# Patient Record
Sex: Male | Born: 1960 | Race: White | Hispanic: No | Marital: Married | Smoking: Former smoker
Health system: Southern US, Community
[De-identification: ages and names within clinical notes are randomized; demographics above are authoritative.]

## PROBLEM LIST (undated history)

## (undated) DIAGNOSIS — M549 Dorsalgia, unspecified: Secondary | ICD-10-CM

## (undated) DIAGNOSIS — I10 Essential (primary) hypertension: Secondary | ICD-10-CM

---

## 2011-07-06 ENCOUNTER — Emergency Department (HOSPITAL_BASED_OUTPATIENT_CLINIC_OR_DEPARTMENT_OTHER)
Admission: EM | Admit: 2011-07-06 | Discharge: 2011-07-06 | Disposition: A | Discharge status: Home / Self Care | Payer: 59 | Attending: Emergency Medicine | Admitting: Emergency Medicine

## 2011-07-06 ENCOUNTER — Encounter (HOSPITAL_BASED_OUTPATIENT_CLINIC_OR_DEPARTMENT_OTHER): Payer: Self-pay | Admitting: Emergency Medicine

## 2011-07-06 DIAGNOSIS — M5431 Sciatica, right side: Secondary | ICD-10-CM

## 2011-07-06 DIAGNOSIS — M545 Low back pain, unspecified: Secondary | ICD-10-CM | POA: Insufficient documentation

## 2011-07-06 DIAGNOSIS — M79609 Pain in unspecified limb: Secondary | ICD-10-CM | POA: Insufficient documentation

## 2011-07-06 DIAGNOSIS — M533 Sacrococcygeal disorders, not elsewhere classified: Secondary | ICD-10-CM | POA: Insufficient documentation

## 2011-07-06 DIAGNOSIS — M5430 Sciatica, unspecified side: Secondary | ICD-10-CM | POA: Insufficient documentation

## 2011-07-06 MED ORDER — OXYCODONE-ACETAMINOPHEN 5-325 MG PO TABS: 1.0000 | ORAL_TABLET | Freq: Four times a day (QID) | ORAL | Status: AC | PRN

## 2011-07-06 MED ORDER — IBUPROFEN 600 MG PO TABS: 600.0000 mg | ORAL_TABLET | Freq: Four times a day (QID) | ORAL | Status: AC | PRN

## 2011-07-06 NOTE — ED Notes (Signed)
Pt c/o RT lower back pain; no known injury

## 2011-07-06 NOTE — ED Provider Notes (Signed)
History     CSN: [id number]  Arrival date & time 07/06/11  1144   First MD Initiated Contact with Patient 07/06/11 1220      Chief Complaint  Patient presents with  . Back Pain    (Consider location/radiation/quality/duration/timing/severity/associated sxs/prior treatment) Patient is a 51 y.o. male presenting with back pain. The history is provided by the patient.  Back Pain  This is a new problem. Pertinent negatives include no numbness, no abdominal pain and no weakness.   patient's had some pain in his right lower back for the last few days. No injury. It radiates down his right leg. No weakness. Some mild relief with aspirin. No dysuria. No abdominal pain. Is worse with movement. Is worse with certain positions. It is worse lying down at night.  History reviewed. No pertinent past medical history.  History reviewed. No pertinent past surgical history.  No family history on file.  History  Substance Use Topics  . Smoking status: Current Everyday Smoker  . Smokeless tobacco: Not on file  . Alcohol Use: Yes     social      Review of Systems  Constitutional: Negative for fatigue.  Gastrointestinal: Negative for abdominal pain.  Genitourinary: Negative for frequency, flank pain and genital sores.  Musculoskeletal: Positive for back pain. Negative for joint swelling and gait problem.  Neurological: Negative for weakness and numbness.  Psychiatric/Behavioral: Negative for confusion.    Allergies  Review of patient's allergies indicates no known allergies.  Home Medications   Current Outpatient Rx  Name Route Sig Dispense Refill  . IBUPROFEN 600 MG PO TABS Oral Take 1 tablet (600 mg total) by mouth every 6 (six) hours as needed for pain. 30 tablet 0  . OXYCODONE-ACETAMINOPHEN 5-325 MG PO TABS Oral Take 1-2 tablets by mouth every 6 (six) hours as needed for pain. 20 tablet 0    BP 123/83  Pulse 65  Temp(Src) 98.1 F (36.7 C) (Oral)  Resp 16  SpO2 99%  Physical  Exam  Constitutional: He appears well-developed and well-nourished.  HENT:  Head: Normocephalic.  Abdominal: Soft. He exhibits no distension. There is no tenderness. There is no rebound.  Musculoskeletal: Normal range of motion.       Mild tenderness over right SI area. No rash. No numbness or weakness to light. Some pain with right straight leg raise  Neurological: He is alert.  Skin: Skin is warm.    ED Course  Procedures (including critical care time)  Labs Reviewed - No data to display No results found.   1. Sciatica of right side       MDM  Right lower back pain radiates down the extremity. Tenderness in the area. Likely sciatica. I doubt kidney stone or AAA as a cause. He'll followup as needed.        Juliet Rude. Rubin Payor, MD 07/06/11 1236

## 2011-07-21 ENCOUNTER — Emergency Department (INDEPENDENT_AMBULATORY_CARE_PROVIDER_SITE_OTHER): Payer: 59

## 2011-07-21 ENCOUNTER — Emergency Department (HOSPITAL_BASED_OUTPATIENT_CLINIC_OR_DEPARTMENT_OTHER)
Admission: EM | Admit: 2011-07-21 | Discharge: 2011-07-21 | Disposition: A | Discharge status: Home / Self Care | Payer: 59 | Attending: Emergency Medicine | Admitting: Emergency Medicine

## 2011-07-21 ENCOUNTER — Encounter (HOSPITAL_BASED_OUTPATIENT_CLINIC_OR_DEPARTMENT_OTHER): Payer: Self-pay | Admitting: *Deleted

## 2011-07-21 DIAGNOSIS — R51 Headache: Secondary | ICD-10-CM | POA: Insufficient documentation

## 2011-07-21 DIAGNOSIS — F17200 Nicotine dependence, unspecified, uncomplicated: Secondary | ICD-10-CM | POA: Insufficient documentation

## 2011-07-21 DIAGNOSIS — I10 Essential (primary) hypertension: Secondary | ICD-10-CM | POA: Insufficient documentation

## 2011-07-21 LAB — CBC
MCHC: 35.2 g/dL (ref 30.0–36.0)
Platelets: 164 10*3/uL (ref 150–400)
RDW: 13.1 % (ref 11.5–15.5)
WBC: 9 10*3/uL (ref 4.0–10.5)

## 2011-07-21 LAB — DIFFERENTIAL
Basophils Absolute: 0.1 10*3/uL (ref 0.0–0.1)
Basophils Relative: 1 % (ref 0–1)
Eosinophils Relative: 3 % (ref 0–5)
Lymphocytes Relative: 42 % (ref 12–46)
Neutro Abs: 4.3 10*3/uL (ref 1.7–7.7)

## 2011-07-21 LAB — BASIC METABOLIC PANEL
CO2: 25 mEq/L (ref 19–32)
Calcium: 9.4 mg/dL (ref 8.4–10.5)
Chloride: 104 mEq/L (ref 96–112)
Creatinine, Ser: 1.1 mg/dL (ref 0.50–1.35)
GFR calc Af Amer: 89 mL/min — ABNORMAL LOW (ref >90)
Sodium: 139 mEq/L (ref 135–145)

## 2011-07-21 MED ORDER — DIPHENHYDRAMINE HCL 50 MG/ML IJ SOLN
25.0000 mg | Freq: Once | INTRAMUSCULAR | Status: AC
  Administered 2011-07-21: 25 mg via INTRAVENOUS
  Filled 2011-07-21: qty 1

## 2011-07-21 MED ORDER — DEXAMETHASONE SODIUM PHOSPHATE 4 MG/ML IJ SOLN
4.0000 mg | Freq: Once | INTRAMUSCULAR | Status: AC
  Administered 2011-07-21: 4 mg via INTRAVENOUS
  Filled 2011-07-21: qty 1

## 2011-07-21 MED ORDER — METOCLOPRAMIDE HCL 5 MG/ML IJ SOLN
10.0000 mg | Freq: Once | INTRAMUSCULAR | Status: AC
  Administered 2011-07-21: 10 mg via INTRAVENOUS
  Filled 2011-07-21: qty 2

## 2011-07-21 NOTE — ED Notes (Signed)
Pt presents to ED today with "intense, worst ever" HA.  Pt reports no changes in vision at present.  Pt rates 8/10 HA.  Pt had similar episode 2 days ago.

## 2011-07-21 NOTE — ED Provider Notes (Signed)
History     CSN: [id number]  Arrival date & time 07/21/11  0101   First MD Initiated Contact with Patient 07/21/11 0114      Chief Complaint  Patient presents with  . Headache  . Hypertension    (Consider location/radiation/quality/duration/timing/severity/associated sxs/prior treatment) Patient is a 51 y.o. male presenting with headaches and hypertension. The history is provided by the patient and the spouse. No language interpreter was used.  Headache  This is a recurrent (one similar under similar circumstances 3 days ago) problem. The current episode started less than 1 hour ago. The problem occurs constantly. The problem has been gradually improving. Associated with: sexual intercourse. The pain is located in the occipital region. The quality of the pain is described as throbbing. The pain is at a severity of 8/10. The pain is severe. The pain does not radiate. Pertinent negatives include no anorexia, no fever, no malaise/fatigue, no chest pressure, no near-syncope, no orthopnea, no palpitations, no syncope, no shortness of breath, no nausea and no vomiting. He has tried NSAIDs for the symptoms. The treatment provided mild relief.  Hypertension Associated symptoms include headaches. Pertinent negatives include no shortness of breath.  Same headache which started during sexual activity 3 days ago but that headache went away with a warm shower.  Today no relief with shower or massage.  Pain went from 8 to 6 within 30 minutes of ibuprofen ingestion today and is currently a 6/10 without radiation.  Today's headache also started during sexual activity and was sudden onset. This headache and the one he had 3 days ago were both characterized as the most intense headaches the patient has had.  Wife checked his BP and it was in the 190s systolic on an electronic cuff at home.    History reviewed. No pertinent past medical history.  History reviewed. No pertinent past surgical history.  History  reviewed. No pertinent family history.  History  Substance Use Topics  . Smoking status: Current Everyday Smoker  . Smokeless tobacco: Not on file  . Alcohol Use: Yes     social      Review of Systems  Constitutional: Negative for fever and malaise/fatigue.  HENT: Negative for neck pain and neck stiffness.   Eyes: Negative.  Negative for photophobia and visual disturbance.  Respiratory: Negative for shortness of breath.   Cardiovascular: Negative for palpitations, orthopnea, syncope and near-syncope.  Gastrointestinal: Negative for nausea, vomiting and anorexia.  Genitourinary: Negative.   Musculoskeletal: Negative.   Skin: Negative.   Neurological: Positive for headaches. Negative for seizures, syncope, speech difficulty and numbness.  Hematological: Negative.   Psychiatric/Behavioral: Negative.     Allergies  Review of patient's allergies indicates no known allergies.  Home Medications  No current outpatient prescriptions on file.  BP 132/90  Pulse 64  Temp(Src) 97.7 F (36.5 C) (Oral)  Resp 16  Ht 6' (1.829 m)  Wt 245 lb (111.131 kg)  BMI 33.23 kg/m2  SpO2 100%  Physical Exam  Constitutional: He is oriented to person, place, and time. He appears well-developed and well-nourished.  HENT:  Head: Normocephalic and atraumatic.  Right Ear: External ear normal.  Left Ear: External ear normal.  Mouth/Throat: Oropharynx is clear and moist.  Eyes: Conjunctivae and EOM are normal. Pupils are equal, round, and reactive to light.  Neck: Normal range of motion. Neck supple.       No meningeal signs  Cardiovascular: Normal rate and regular rhythm.   Pulmonary/Chest: Effort normal and breath sounds  normal. He has no wheezes. He has no rales.  Abdominal: Soft. Bowel sounds are normal. There is no tenderness. There is no rebound and no guarding.  Musculoskeletal: Normal range of motion. He exhibits edema.  Lymphadenopathy:    He has no cervical adenopathy.  Neurological:  He is alert and oriented to person, place, and time. He has normal reflexes. He displays normal reflexes. No cranial nerve deficit.       Gait normal  Skin: Skin is warm and dry.  Psychiatric: He has a normal mood and affect. Judgment and thought content normal.    ED Course  Procedures (including critical care time)  Labs Reviewed  BASIC METABOLIC PANEL - Abnormal; Notable for the following:    Glucose, Bld 104 (*)    GFR calc non Af Amer 77 (*)    GFR calc Af Amer 89 (*)    All other components within normal limits  CBC  DIFFERENTIAL   Ct Head Wo Contrast  07/21/2011  *RADIOLOGY REPORT*  Clinical Data: Severe headache; hypertension.  CT HEAD WITHOUT CONTRAST  Technique:  Contiguous axial images were obtained from the base of the skull through the vertex without contrast.  Comparison: None.  Findings: There is no evidence of acute infarction, mass lesion, or intra- or extra-axial hemorrhage on CT.  The posterior fossa, including the cerebellum, brainstem and fourth ventricle, is within normal limits.  The third and lateral ventricles, and basal ganglia are unremarkable in appearance.  The cerebral hemispheres are symmetric in appearance, with normal gray- white differentiation.  No mass effect or midline shift is seen.  There is no evidence of fracture; visualized osseous structures are unremarkable in appearance.  The orbits are within normal limits. The paranasal sinuses and mastoid air cells are well-aerated.  No significant soft tissue abnormalities are seen.  IMPRESSION: Unremarkable noncontrast CT of the head.  Original Report Authenticated By: Tonia Ghent, M.D.     1. Headache       MDM  EDP immediately recommended lumbar puncture based on patient's symptoms.  Strongly encouraged LP even prior to Ct scan of the head as story is concerning for sentinel leak of aneurysm.    EDP returned following CT review and radiology read and stated that lumbar puncture was necessary and  imperative to diagnose subarachnoid hemorrhage and it was necessary to do this procedure immediately in order to make the diagnosis and arrange for further care..  EDP explained that the benefits of lumbar puncture were diagnosis of earlier aneurysmal bleed and or subarachnoid hemorrhage and thus transfer to a facility to further intervention and treatment of same.  EDP also explained that the risks of lumbar puncture were infection, minimized by surgical cleansing of the skin, bleeding at the site, worsening headache, persistent CSF leak occasionally requiring blood patch.    Patient and wife given 10 minutes in private to discuss the risks and benefits of the procedure.  Patient and wife discussed the procedure and the risks and benefits.  EDP again strongly advised that the patient undergo lumbar puncture because his symptoms were extremely concerning for subarachnoid bleed.  Patient is of sound mind and has decision making capacity to refuse further care and refuses lumbar puncture. EDP explained that this decision was against the best medical advice.  Patient verbalizes understanding that his decision is against sound medical advice.  Patient and wife advised that the risks of refusing procedure and further care and of signing out against medical advice are but are  not limited to:  Death, stroke, aneurysm, intracranial hemorrhage, coma, paralysis, hemiplegia, visual deficits, aphasia, speech impairment, and prolonged morbidity.  Patient verbalizes understanding of these risks and verbalizes willingness to accept these risks.  He is welcomed to return at any time.      Jasmine Awe, MD 07/21/11 [phone]

## 2011-07-21 NOTE — Discharge Instructions (Signed)

## 2013-04-18 IMAGING — CT CT HEAD W/O CM
1 series · 16 of 30 positions shown, 20 images · non-contrast
Comparison: None.

CLINICAL DATA: Severe headache; hypertension.

CT HEAD WITHOUT CONTRAST
TECHNIQUE: Contiguous axial images were obtained from the base of
the skull through the vertex without contrast.

[Series 2: head 4.8 h37s · axial · 0.45mm/px · z∈[-87,+73]mm · 16 of 36 slices shown, 20 images]
[im 2/36  brain]
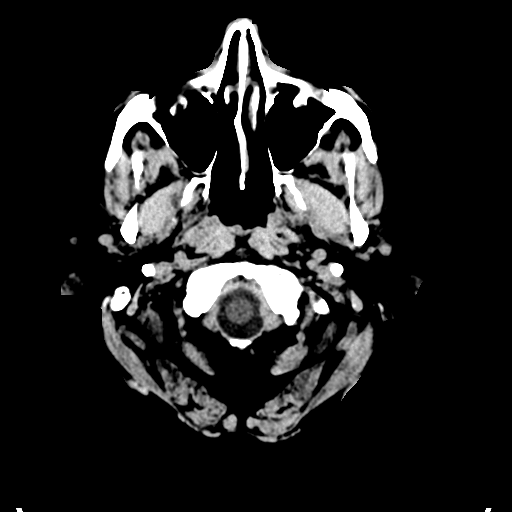
[im 2/36  bone]
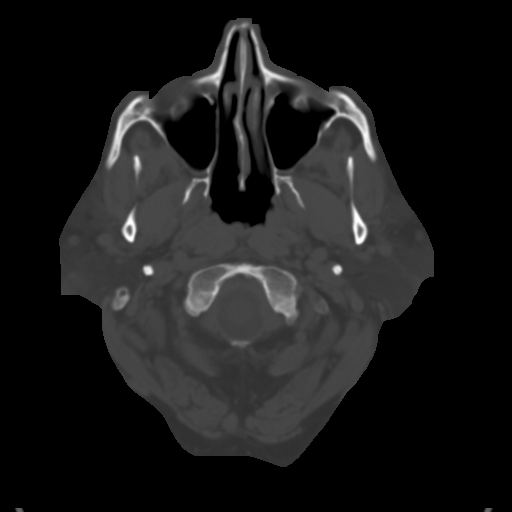
[im 4/36  brain]
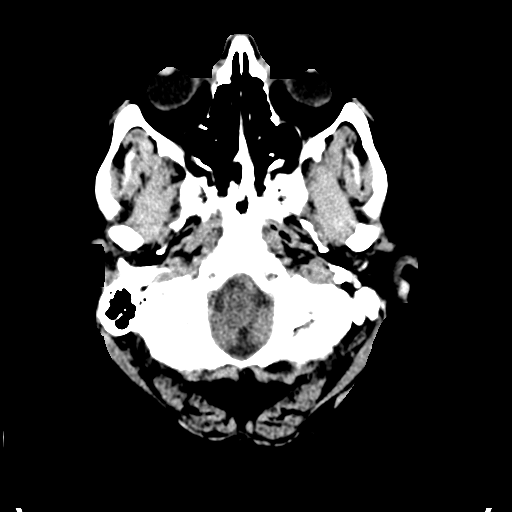
[im 7/36  brain]
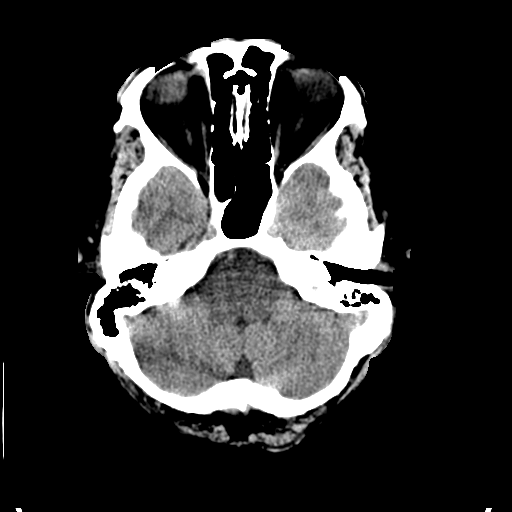
[im 9/36  brain]
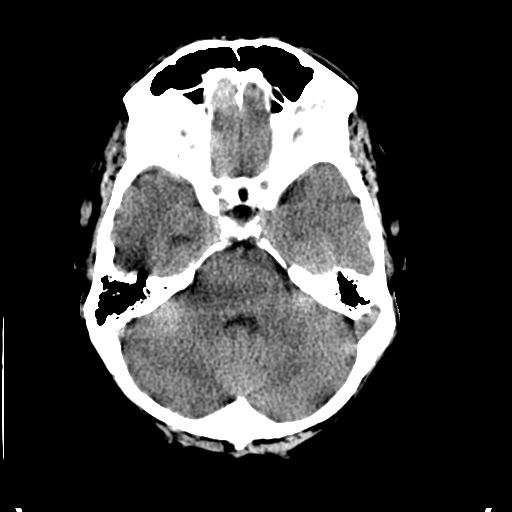
[im 10/36  brain]
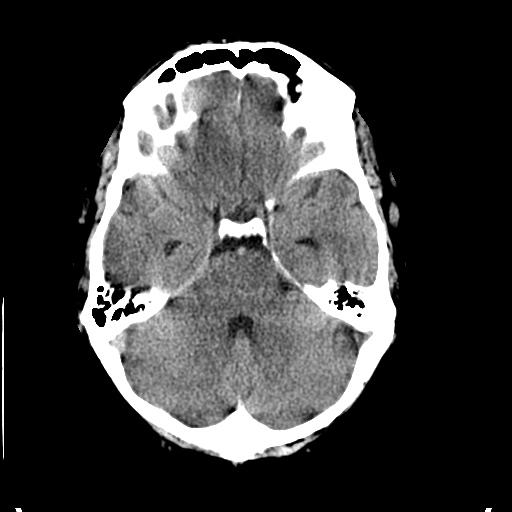
[im 10/36  bone]
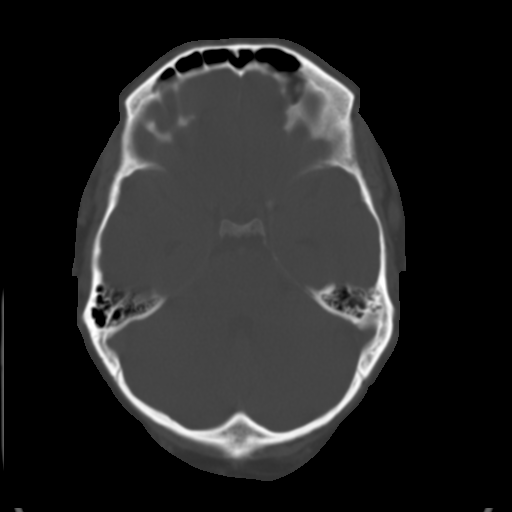
[im 13/36  brain]
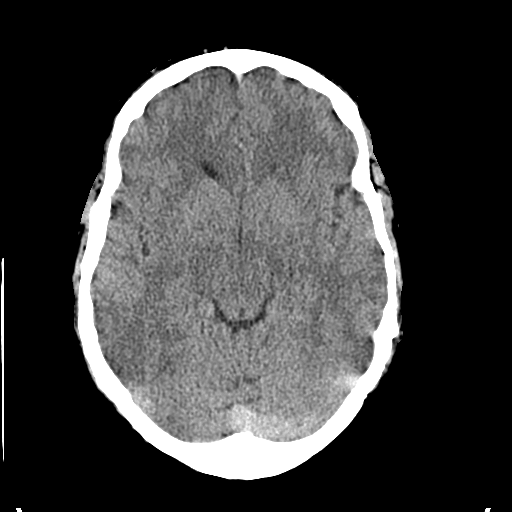
[im 15/36  brain]
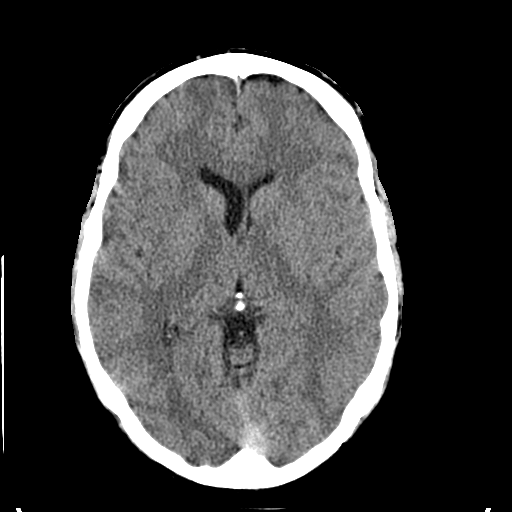
[im 17/36  brain]
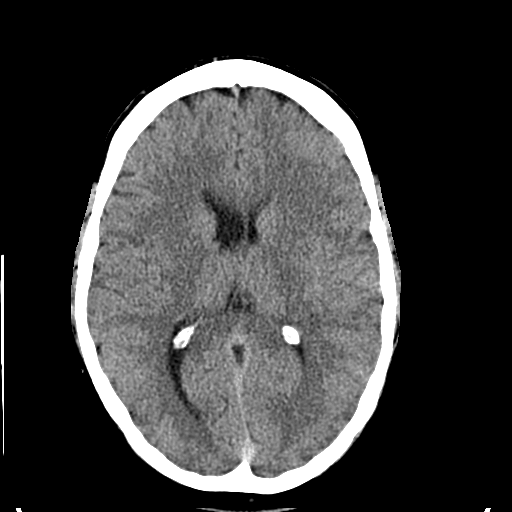
[im 19/36  brain]
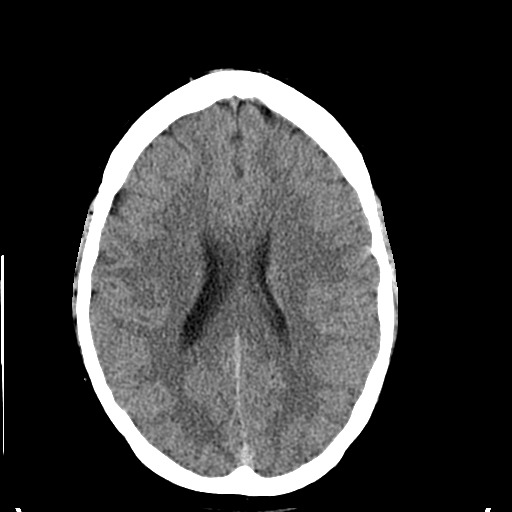
[im 19/36  bone]
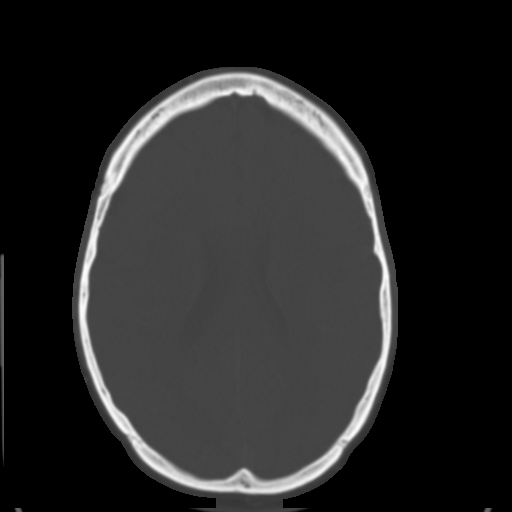
[im 21/36  brain]
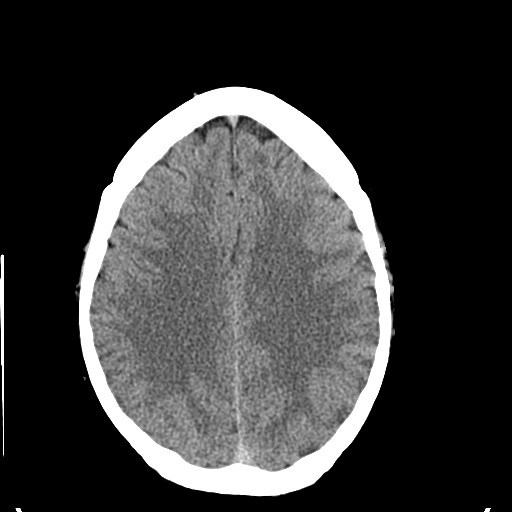
[im 23/36  brain]
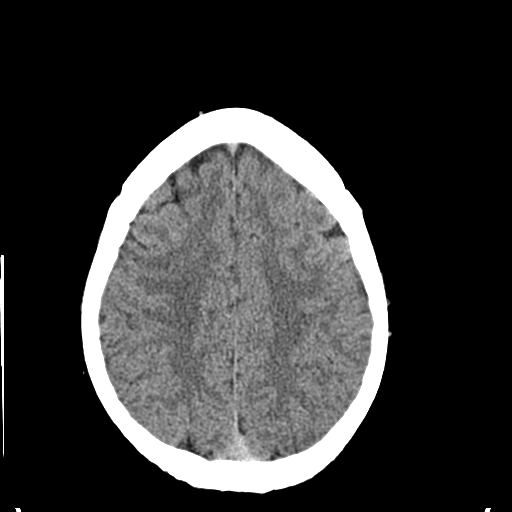
[im 26/36  brain]
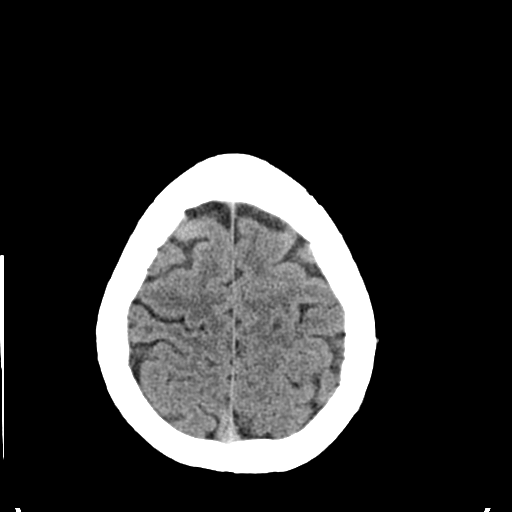
[im 27/36  brain]
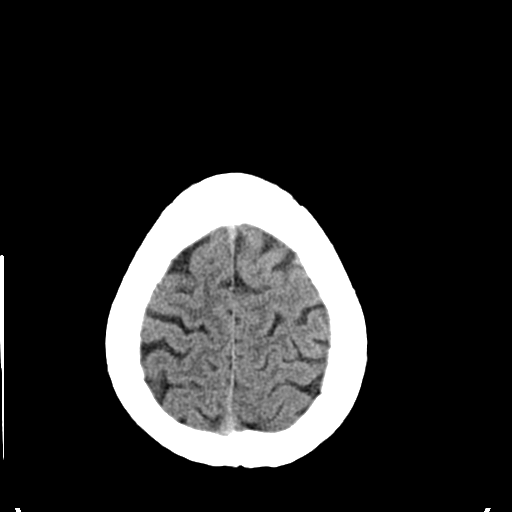
[im 27/36  bone]
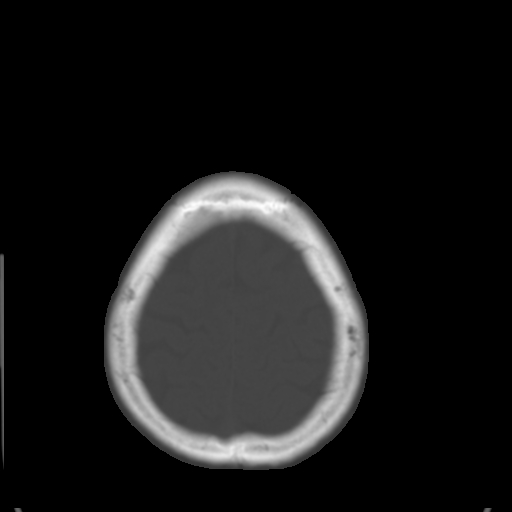
[im 29/36  brain]
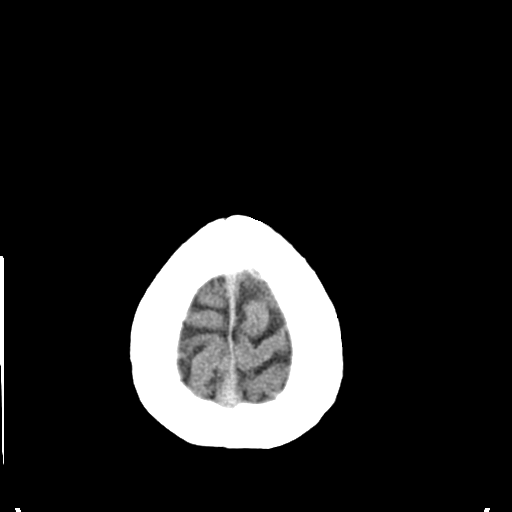
[im 32/36  brain]
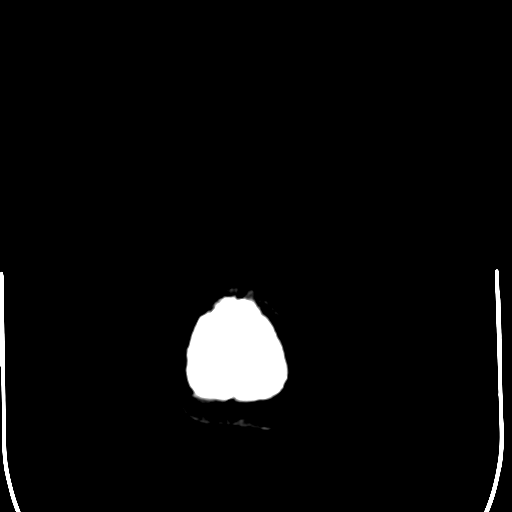
[im 34/36  brain]
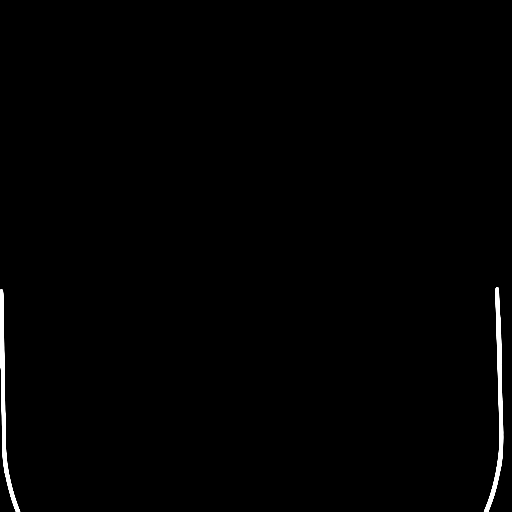

[16 of 30 positions shown; findings below may reference images not displayed]

FINDINGS: There is no evidence of acute infarction, mass lesion, or
intra- or extra-axial hemorrhage on CT.

The posterior fossa, including the cerebellum, brainstem and fourth
ventricle, is within normal limits.  The third and lateral
ventricles, and basal ganglia are unremarkable in appearance.  The
cerebral hemispheres are symmetric in appearance, with normal gray-
white differentiation.  No mass effect or midline shift is seen.

There is no evidence of fracture; visualized osseous structures are
unremarkable in appearance.  The orbits are within normal limits.
The paranasal sinuses and mastoid air cells are well-aerated.  No
significant soft tissue abnormalities are seen.
IMPRESSION: Unremarkable noncontrast CT of the head.

## 2016-08-04 ENCOUNTER — Emergency Department (HOSPITAL_BASED_OUTPATIENT_CLINIC_OR_DEPARTMENT_OTHER): Payer: Managed Care, Other (non HMO)

## 2016-08-04 ENCOUNTER — Encounter (HOSPITAL_BASED_OUTPATIENT_CLINIC_OR_DEPARTMENT_OTHER): Payer: Self-pay

## 2016-08-04 ENCOUNTER — Emergency Department (HOSPITAL_BASED_OUTPATIENT_CLINIC_OR_DEPARTMENT_OTHER)
Admission: EM | Admit: 2016-08-04 | Discharge: 2016-08-05 | Disposition: A | Discharge status: Home / Self Care | Payer: Managed Care, Other (non HMO) | Attending: Emergency Medicine | Admitting: Emergency Medicine

## 2016-08-04 DIAGNOSIS — R1011 Right upper quadrant pain: Secondary | ICD-10-CM | POA: Diagnosis present

## 2016-08-04 DIAGNOSIS — K8020 Calculus of gallbladder without cholecystitis without obstruction: Secondary | ICD-10-CM | POA: Diagnosis not present

## 2016-08-04 DIAGNOSIS — Z79899 Other long term (current) drug therapy: Secondary | ICD-10-CM | POA: Diagnosis not present

## 2016-08-04 DIAGNOSIS — Z87891 Personal history of nicotine dependence: Secondary | ICD-10-CM | POA: Insufficient documentation

## 2016-08-04 DIAGNOSIS — I10 Essential (primary) hypertension: Secondary | ICD-10-CM | POA: Insufficient documentation

## 2016-08-04 HISTORY — DX: Essential (primary) hypertension: I10

## 2016-08-04 HISTORY — DX: Dorsalgia, unspecified: M54.9

## 2016-08-04 LAB — CBC WITH DIFFERENTIAL/PLATELET
Basophils Absolute: 0 10*3/uL (ref 0.0–0.1)
Basophils Relative: 0 %
EOS PCT: 0 %
Eosinophils Absolute: 0 10*3/uL (ref 0.0–0.7)
HCT: 48.1 % (ref 39.0–52.0)
Hemoglobin: 16.8 g/dL (ref 13.0–17.0)
LYMPHS PCT: 11 %
Lymphs Abs: 1.1 10*3/uL (ref 0.7–4.0)
MCH: 32.6 pg (ref 26.0–34.0)
MCHC: 34.9 g/dL (ref 30.0–36.0)
MCV: 93.2 fL (ref 78.0–100.0)
MONO ABS: 0.4 10*3/uL (ref 0.1–1.0)
MONOS PCT: 4 %
Neutro Abs: 8 10*3/uL — ABNORMAL HIGH (ref 1.7–7.7)
Neutrophils Relative %: 85 %
PLATELETS: 167 10*3/uL (ref 150–400)
RBC: 5.16 MIL/uL (ref 4.22–5.81)
RDW: 14.2 % (ref 11.5–15.5)
WBC: 9.5 10*3/uL (ref 4.0–10.5)

## 2016-08-04 LAB — COMPREHENSIVE METABOLIC PANEL
ALT: 19 U/L (ref 17–63)
ANION GAP: 7 (ref 5–15)
AST: 30 U/L (ref 15–41)
Albumin: 4.8 g/dL (ref 3.5–5.0)
Alkaline Phosphatase: 94 U/L (ref 38–126)
BILIRUBIN TOTAL: 0.7 mg/dL (ref 0.3–1.2)
BUN: 20 mg/dL (ref 6–20)
CHLORIDE: 101 mmol/L (ref 101–111)
CO2: 28 mmol/L (ref 22–32)
Calcium: 9.4 mg/dL (ref 8.9–10.3)
Creatinine, Ser: 1.21 mg/dL (ref 0.61–1.24)
GFR calc non Af Amer: >60 mL/min (ref >60)
Glucose, Bld: 149 mg/dL — ABNORMAL HIGH (ref 65–99)
POTASSIUM: 3.8 mmol/L (ref 3.5–5.1)
Sodium: 136 mmol/L (ref 135–145)
TOTAL PROTEIN: 8.5 g/dL — AB (ref 6.5–8.1)

## 2016-08-04 LAB — URINALYSIS, ROUTINE W REFLEX MICROSCOPIC
Bilirubin Urine: NEGATIVE
Glucose, UA: NEGATIVE mg/dL
Hgb urine dipstick: NEGATIVE
KETONES UR: NEGATIVE mg/dL
LEUKOCYTES UA: NEGATIVE
NITRITE: NEGATIVE
Protein, ur: NEGATIVE mg/dL
Specific Gravity, Urine: 1.018 (ref 1.005–1.030)
pH: 6.5 (ref 5.0–8.0)

## 2016-08-04 LAB — LIPASE, BLOOD: LIPASE: 29 U/L (ref 11–51)

## 2016-08-04 LAB — TROPONIN I

## 2016-08-04 MED ORDER — MORPHINE SULFATE (PF) 4 MG/ML IV SOLN
8.0000 mg | Freq: Once | INTRAVENOUS | Status: AC
  Administered 2016-08-04: 8 mg via INTRAVENOUS
  Filled 2016-08-04: qty 2

## 2016-08-04 MED ORDER — OXYCODONE HCL 5 MG PO TABS
5.0000 mg | ORAL_TABLET | Freq: Once | ORAL | Status: AC
  Administered 2016-08-04: 5 mg via ORAL
  Filled 2016-08-04: qty 1

## 2016-08-04 MED ORDER — OXYCODONE HCL 5 MG PO TABS: 5.0000 mg | ORAL_TABLET | Freq: Four times a day (QID) | ORAL | 0 refills | Status: AC | PRN

## 2016-08-04 NOTE — ED Triage Notes (Signed)
c/o sudden onset abd pain today at 4pm-vomited x 2-NAD-steady gait

## 2016-08-04 NOTE — ED Notes (Signed)
Pt on monitor 

## 2016-08-04 NOTE — Discharge Instructions (Signed)
Please maintain a clear liquid diet for the next 24-48 hours and progress with bland foods if you are not having symptoms. Go immediately to [facility] or [facility] emergency department for re-evaluation if your pain worsens or you develop fevers or other concerning symptoms. Schedule office follow-up with general surgery for removal of your gallbladder if your symptoms resolve.

## 2016-08-04 NOTE — ED Notes (Signed)
Pt reports right upper quadrant pain since 1550 this date. Pt reports vomiting only.

## 2016-08-04 NOTE — ED Provider Notes (Signed)
[facility] Provider Note   CSN: [id number] Arrival date & time: 08/04/16  1802  By signing my name below, I, Linna Darner, attest that this documentation has been prepared under the direction and in the presence of physician practitioner, Lyndal Pulley, MD. Electronically Signed: Linna Darner, Scribe. 08/04/2016. 7:14 PM.  History   Chief Complaint Chief Complaint  Patient presents with  . Abdominal Pain    The history is provided by the patient. No language interpreter was used.  Abdominal Pain   This is a new problem. The current episode started 3 to 5 hours ago. The problem occurs constantly. The problem has not changed since onset.The pain is severe. Associated symptoms include nausea and vomiting. Pertinent negatives include fever. The symptoms are aggravated by palpation. Nothing relieves the symptoms. Past workup does not include GI consult, CT scan, ultrasound or barium enema. His past medical history does not include PUD, gallstones, GERD, ulcerative colitis, Crohn's disease or irritable bowel syndrome.     HPI Comments: Joshua Kidd is a 56 y.o. male with PMHx of HTN who presents to the Emergency Department complaining of sudden onset, constant, RUQ pain beginning shortly before 4 PM today. He reports associated nausea and vomiting. He states his pain is worse with applied pressure to his RUQ. Last PO was shortly before onset of his symptoms. No h/o the same. No medications or treatments tried PTA. No h/o abdominal surgery. No known FMHx of gallstones or cholecystectomy. He denies fevers, chills, or any other associated symptoms.  Past Medical History:  Diagnosis Date  . Back pain   . Hypertension     There are no active problems to display for this patient.   History reviewed. No pertinent surgical history.     Home Medications    Prior to Admission medications   Medication Sig Start Date End Date Taking? Authorizing Provider  Cyclobenzaprine HCl  (FLEXERIL PO) Take by mouth.   Yes Historical Provider, MD  HYDRALAZINE-HCTZ PO Take by mouth.   Yes Historical Provider, MD    Family History No family history on file.  Social History Social History  Substance Use Topics  . Smoking status: Former Games developer  . Smokeless tobacco: Never Used  . Alcohol use Yes     Comment: daily     Allergies   Patient has no known allergies.   Review of Systems Review of Systems  Constitutional: Negative for chills and fever.  Gastrointestinal: Positive for abdominal pain, nausea and vomiting.  All other systems reviewed and are negative.  Physical Exam Updated Vital Signs BP (!) 167/119 (BP Location: Left Arm)   Pulse 66   Temp 97.6 F (36.4 C) (Oral)   Resp 20   Ht  (1.803 m)   Wt 265 lb (120.2 kg)   SpO2 97%   BMI 36.96 kg/m   Physical Exam  Constitutional: He is oriented to person, place, and time. He appears well-developed and well-nourished. No distress.  HENT:  Head: Normocephalic and atraumatic.  Nose: Nose normal.  Eyes: Conjunctivae are normal.  Neck: Neck supple. No tracheal deviation present.  Cardiovascular: Normal rate and regular rhythm.   Pulmonary/Chest: Effort normal. No respiratory distress.  Abdominal: Soft. He exhibits no distension. There is tenderness. There is guarding and positive Murphy's sign.  Tenderness and involuntary guarding of the RUQ.  Neurological: He is alert and oriented to person, place, and time.  Skin: Skin is warm and dry.  Psychiatric: He has a normal mood and affect.  ED Treatments / Results  Labs (all labs ordered are listed, but only abnormal results are displayed) Labs Reviewed  CBC WITH DIFFERENTIAL/PLATELET - Abnormal; Notable for the following:       Result Value   Neutro Abs 8.0 (*)    All other components within normal limits  COMPREHENSIVE METABOLIC PANEL - Abnormal; Notable for the following:    Glucose, Bld 149 (*)    Total Protein 8.5 (*)    All other  components within normal limits  URINALYSIS, ROUTINE W REFLEX MICROSCOPIC - Abnormal; Notable for the following:    APPearance CLOUDY (*)    All other components within normal limits  LIPASE, BLOOD  TROPONIN I    EKG  EKG Interpretation  Date/Time:  Monday August 04 2016 18:12:50 EDT Ventricular Rate:  65 PR Interval:  152 QRS Duration: 112 QT Interval:  436 QTC Calculation: 453 R Axis:   -4 Text Interpretation:  Normal sinus rhythm Left ventricular hypertrophy Nonspecific ST abnormality Abnormal ECG No previous tracing Confirmed by Jansel Vonstein MD, Judi Jaffe ([id number]) on 08/04/2016 6:43:11 PM       Radiology US Abdomen Limited Ruq  Result Date: 08/04/2016 CLINICAL DATA:  Right upper quadrant pain today. EXAM: US ABDOMEN LIMITED - RIGHT UPPER QUADRANT COMPARISON:  None. FINDINGS: Gallbladder: Gallstones identified. There is gallbladder wall thickened. The ultrasound technologist reports positive sonographic Murphy sign. Common bile duct: Diameter: 2.8 mm Liver: There is a simple cysts in the left lobe liver measuring 1.5 cm. Within normal limits in parenchymal echogenicity. IMPRESSION: Findings suspicious for acute cholecystitis. Electronically Signed   By: Sherian Rein M.D.   On: 08/04/2016 21:40    Procedures Procedures (including critical care time)  DIAGNOSTIC STUDIES: Oxygen Saturation is 97% on RA, normal by my interpretation.    COORDINATION OF CARE: 7:21 PM Discussed treatment plan with pt at bedside and pt agreed to plan.  Medications Ordered in ED Medications  morphine 4 MG/ML injection 8 mg (8 mg Intravenous Given 08/04/16 2013)  oxyCODONE (Oxy IR/ROXICODONE) immediate release tablet 5 mg (5 mg Oral Given 08/04/16 2243)     Initial Impression / Assessment and Plan / ED Course  I have reviewed the triage vital signs and the nursing notes.  Pertinent labs & imaging results that were available during my care of the patient were reviewed by me and considered in my medical decision  making (see chart for details).     56 y.o. male presents with sudden onset RUQ pain starting after dinner tonight. Has + murphy's on arrival with vomiting which has subsided. No leukocytosis, no LFT elevation, pain well controlled here with medications. US performed shows wall thickening and stones confirm biliary colic as likely etiology but no pericholecystic fluid and no other signs of cholecystitis. Discussed with general surgery on call and they recommended clear fluids for 24-48 hours and symptomatic management with OP f/u as long as symptoms continue to improve. Pt instructed to return immediately to facility capable of surgical intervention if symptoms are persistent, worsening, fevers develop, or he has inability to tolerate PO. If symptoms resolve he is to f/u with general surgery for elective cholecystectomy at a later date.   Final Clinical Impressions(s) / ED Diagnoses   Final diagnoses:  RUQ abdominal pain  Symptomatic cholelithiasis    New Prescriptions Discharge Medication List as of 08/04/2016 11:39 PM    START taking these medications   Details  oxyCODONE (ROXICODONE) 5 MG immediate release tablet Take 1 tablet (5 mg total)  by mouth every 6 (six) hours as needed for severe pain., Starting Mon 08/04/2016, Print       I personally performed the services described in this documentation, which was scribed in my presence. The recorded information has been reviewed and is accurate.     Lyndal Pulley, MD 08/05/16 [phone]

## 2018-05-21 IMAGING — US US ABDOMEN LIMITED
1 series · 14 of 25 positions shown · non-contrast
Comparison: None.

CLINICAL DATA: Right upper quadrant pain today.

EXAM:
US ABDOMEN LIMITED - RIGHT UPPER QUADRANT

[Series 1: us abdomen limited · 0.22mm/px · 14 of 54 slices shown]
[im 1/54]
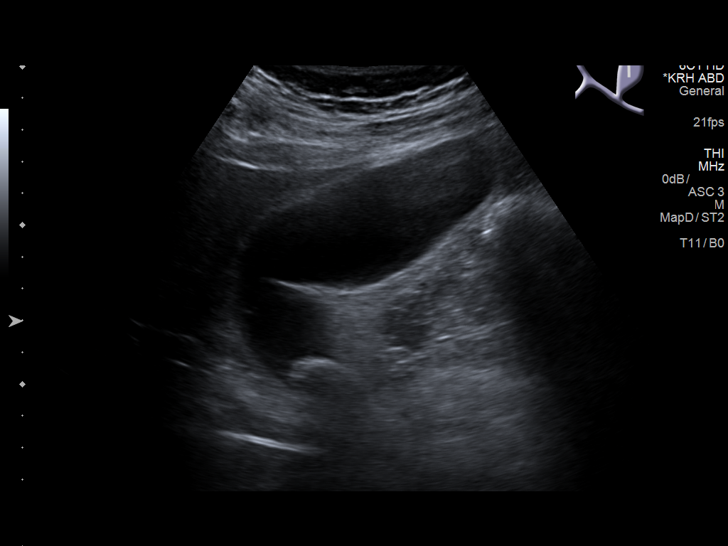
[im 5/54]
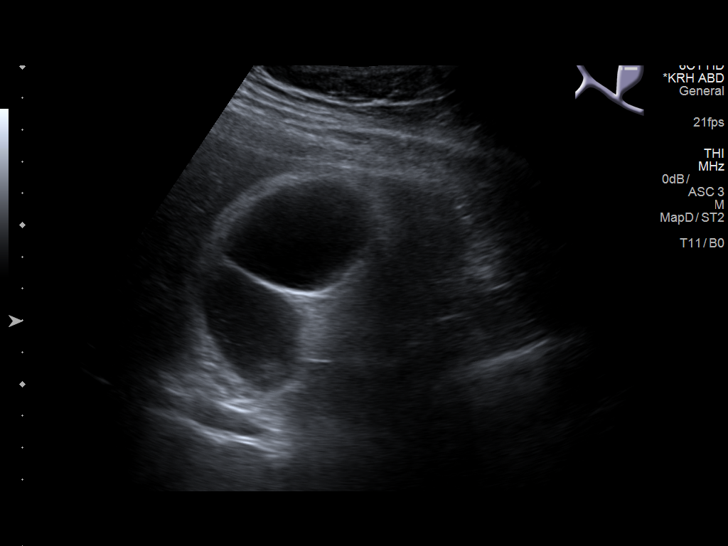
[im 9/54]
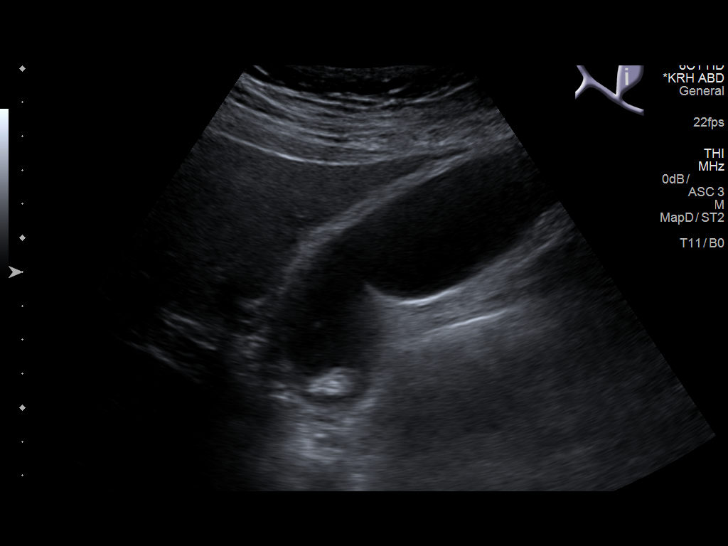
[im 14/54]
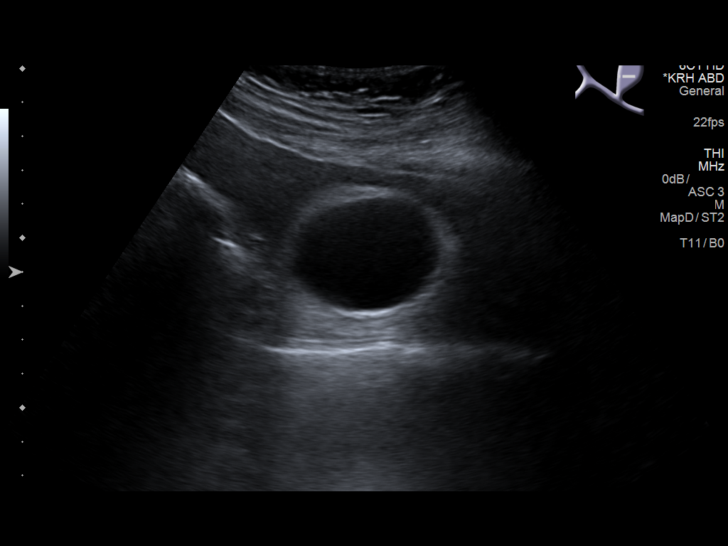
[im 18/54]
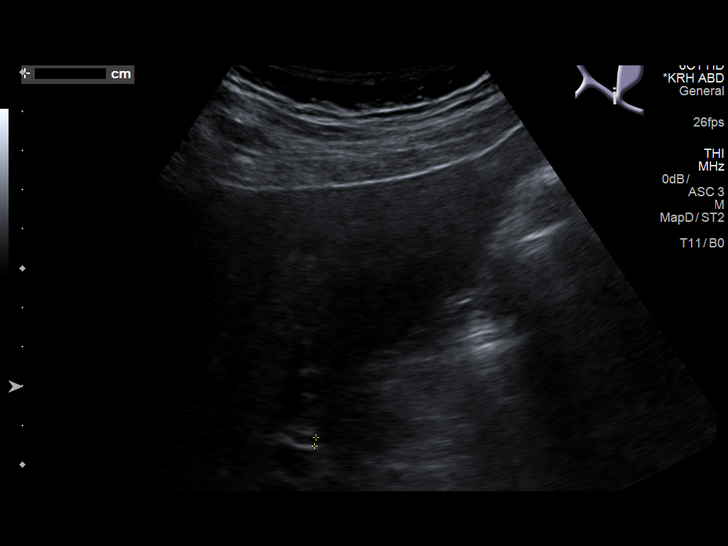
[im 20/54]
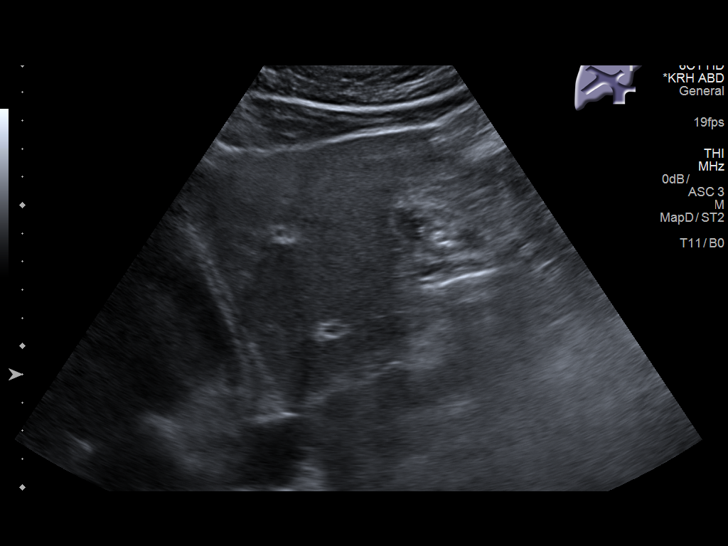
[im 25/54]
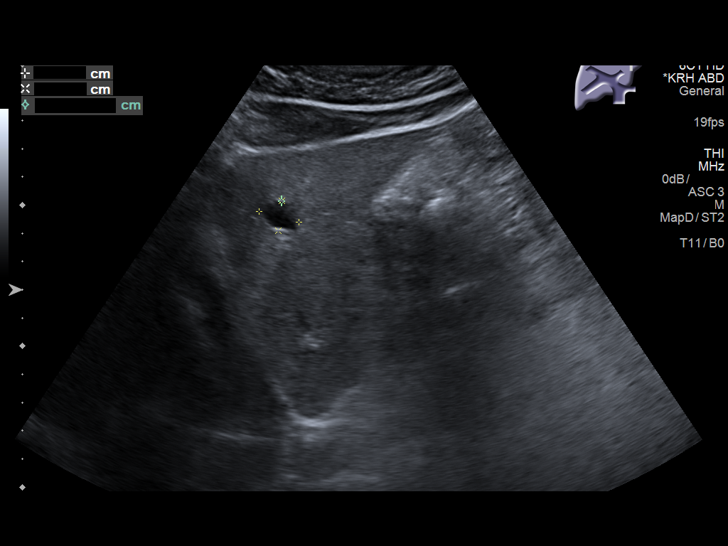
[im 29/54]
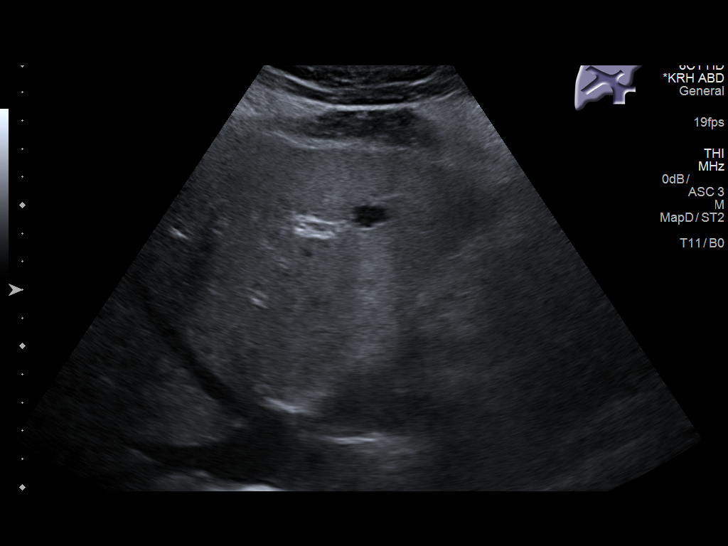
[im 34/54]
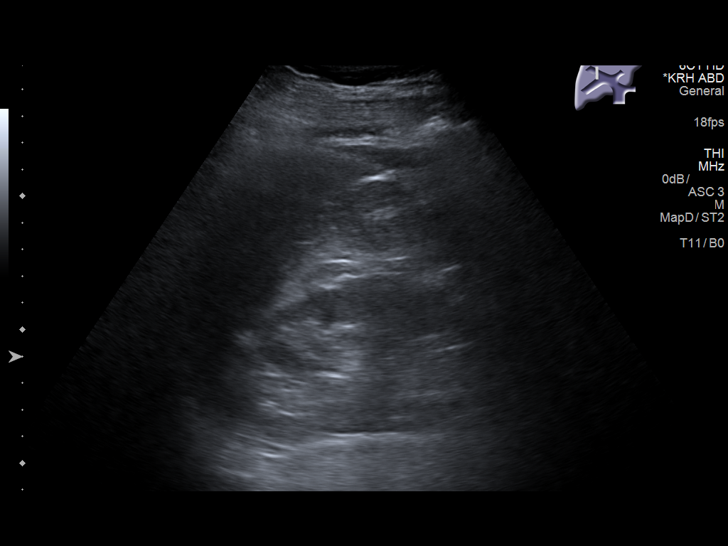
[im 36/54]
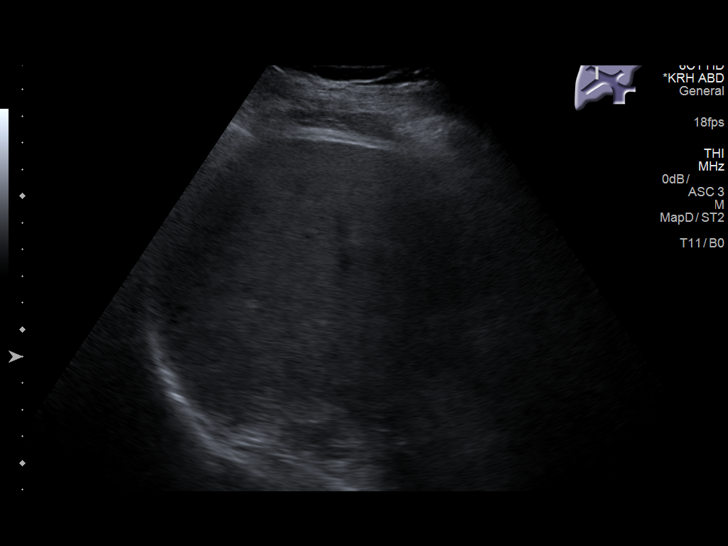
[im 40/54]
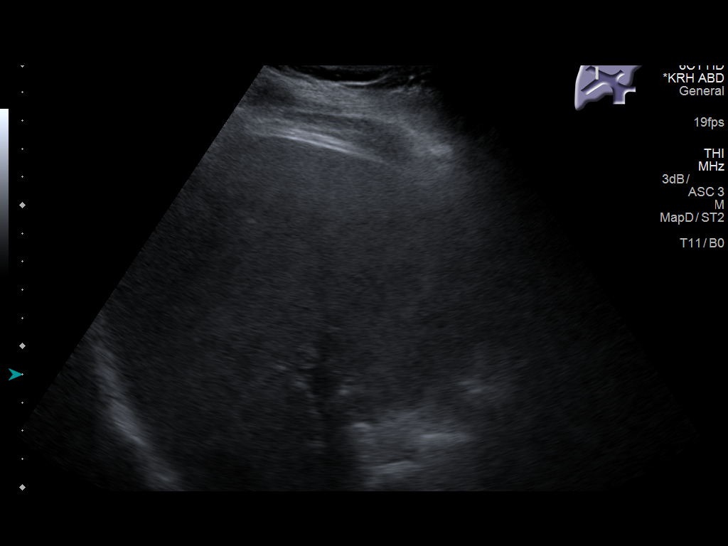
[im 45/54]
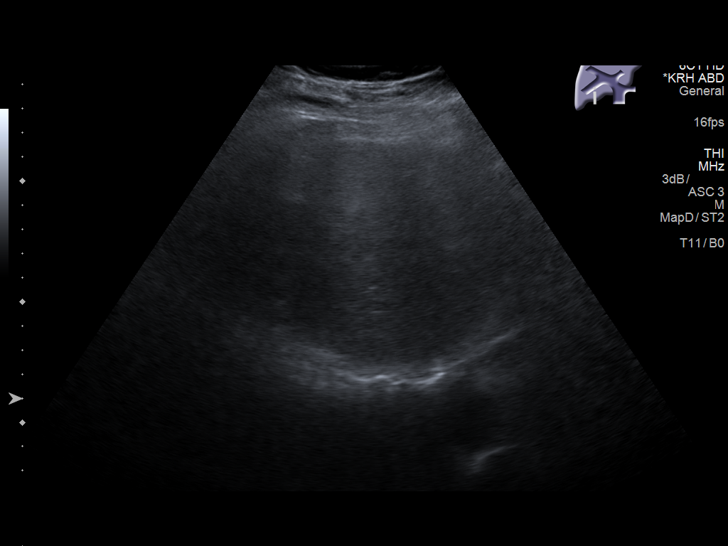
[im 49/54]
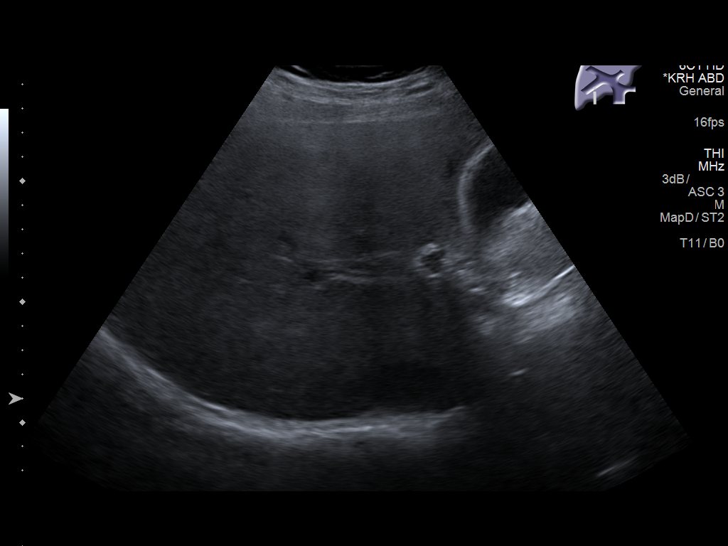
[im 54/54]
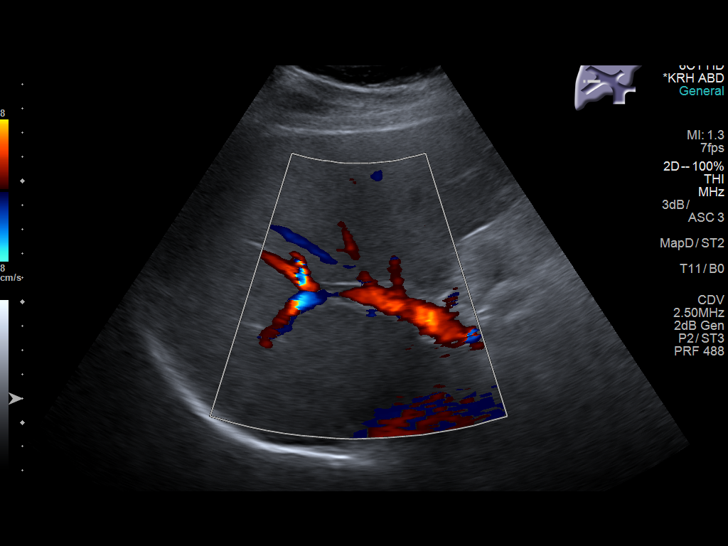

[14 of 25 positions shown; findings below may reference images not displayed]

FINDINGS: Gallbladder:

Gallstones identified. There is gallbladder wall thickened. The
ultrasound technologist reports positive sonographic Murphy sign.

Common bile duct:

Diameter: 2.8 mm

Liver:

There is a simple cysts in the left lobe liver measuring 1.5 cm.
Within normal limits in parenchymal echogenicity.
IMPRESSION: Findings suspicious for acute cholecystitis.
